# Patient Record
Sex: Female | Born: 1945 | Race: White | Hispanic: No | Marital: Married
Health system: Southern US, Community
[De-identification: ages and names within clinical notes are randomized; demographics above are authoritative.]

---

## 2013-06-17 ENCOUNTER — Ambulatory Visit: Payer: Self-pay | Admitting: Gastroenterology

## 2013-06-25 ENCOUNTER — Ambulatory Visit: Payer: Self-pay | Admitting: Gastroenterology

## 2013-06-26 ENCOUNTER — Ambulatory Visit: Payer: Self-pay | Admitting: Gastroenterology

## 2014-12-06 ENCOUNTER — Ambulatory Visit: Payer: Self-pay | Admitting: Cardiology

## 2014-12-06 LAB — CBC WITH DIFFERENTIAL/PLATELET
Basophil #: 0 10*3/uL (ref 0.0–0.1)
Basophil %: 0.6 %
EOS ABS: 0.3 10*3/uL (ref 0.0–0.7)
Eosinophil %: 4.8 %
HCT: 42.1 % (ref 35.0–47.0)
HGB: 13.8 g/dL (ref 12.0–16.0)
LYMPHS ABS: 1.6 10*3/uL (ref 1.0–3.6)
Lymphocyte %: 28 %
MCH: 30.7 pg (ref 26.0–34.0)
MCHC: 32.8 g/dL (ref 32.0–36.0)
MCV: 94 fL (ref 80–100)
Monocyte #: 0.5 x10 3/mm (ref 0.2–0.9)
Monocyte %: 9.2 %
NEUTROS PCT: 57.4 %
Neutrophil #: 3.3 10*3/uL (ref 1.4–6.5)
PLATELETS: 177 10*3/uL (ref 150–440)
RBC: 4.49 10*6/uL (ref 3.80–5.20)
RDW: 12.9 % (ref 11.5–14.5)
WBC: 5.7 10*3/uL (ref 3.6–11.0)

## 2014-12-06 LAB — URINALYSIS, COMPLETE
Bilirubin,UR: NEGATIVE
Blood: NEGATIVE
GLUCOSE, UR: NEGATIVE mg/dL (ref 0–75)
KETONE: NEGATIVE
Leukocyte Esterase: NEGATIVE
Nitrite: NEGATIVE
Ph: 5 (ref 4.5–8.0)
Protein: 30
SPECIFIC GRAVITY: 1.03 (ref 1.003–1.030)

## 2014-12-06 LAB — BASIC METABOLIC PANEL
Anion Gap: 7 (ref 7–16)
BUN: 27 mg/dL — AB (ref 7–18)
CHLORIDE: 112 mmol/L — AB (ref 98–107)
Calcium, Total: 8.9 mg/dL (ref 8.5–10.1)
Co2: 25 mmol/L (ref 21–32)
Creatinine: 1.06 mg/dL (ref 0.60–1.30)
EGFR (Non-African Amer.): 55 — ABNORMAL LOW
Glucose: 87 mg/dL (ref 65–99)
Osmolality: 291 (ref 275–301)
Potassium: 4.7 mmol/L (ref 3.5–5.1)
Sodium: 144 mmol/L (ref 136–145)

## 2014-12-06 LAB — APTT: Activated PTT: 30.2 secs (ref 23.6–35.9)

## 2014-12-06 LAB — PROTIME-INR
INR: 1
Prothrombin Time: 13.3 secs (ref 11.5–14.7)

## 2014-12-10 ENCOUNTER — Emergency Department: Payer: Self-pay | Admitting: Emergency Medicine

## 2014-12-10 ENCOUNTER — Ambulatory Visit: Payer: Self-pay | Admitting: Cardiology

## 2015-04-03 NOTE — Op Note (Signed)
PATIENT NAME:  Belinda Kim, Belinda Kim MR#:  [id number] DATE OF BIRTH:  02/18/1946  DATE OF PROCEDURE:  12/10/2014  PROCEDURE: Removal and replacement of biventricular ICD generator.   INDICATION: ICD generator at elective replacement indicator.   POSTPROCEDURE DIAGNOSIS: Implantation of a biventricular implantable cardiac defibrillator generator in situ.   DETAILS OF PROCEDURE: Written informed consent was obtained and placed in the patient'Kim permanent medical record. The risks of the procedure were explained to the patient and she consented to proceed. The patient was brought to the operating room in a fasting, nonsedated state. The appropriate resuscitative equipment was attached to the patient and the patient was prepped and draped in the usual sterile manner. The area of the left infraclavicular fossa was scrubbed with chlorhexidine. Local anesthetic was administered to the patient. Using a scalpel, an incision was made over the old incision site. Using a combination of electrocautery and blunt dissection, the ICD pocket was opened in the typical fashion and the device was removed. The leads were checked and had shown to be functioning appropriately. The pocket was irrigated with copious amounts of bacitracin. Adequate hemostasis was obtained. The new generator was attached to the old leads and placed in the pocket. The pocket was closed with running layers of 2-0 Vicryl, 3-0 and 4-0 V-Loc. Steri-Strips were applied over the incision as well as a sterile gauze and OpSite.   ANALYSIS: Of the final numbers of the patient'Kim device were as follows: Lead impedances for the atrial lead were 399 ohms, RV lead 399 ohms, LV lead pacing and LV tip to RV coil configuration was 646, RV defibrillator coil 47 ohms and the SVC coil was 61 ohms. P waves were 2.8 mV, R waves 9.6 mV. The threshold on the LV lead was in the LV coil to RV ring configuration 1 volt at 1 millisecond.   SUMMARY OF IMPLANTED HARDWARE: The  patient has a Medtronic Viva XT CRT-D device. Serial number [id number]. The pacing mode is DDD with a lower rate limit of 50, upper tracking rate 130, upper sensory rate 120. Outputs for the atrial leads 3.5 volts at 0.4 milliseconds, RV 3.5 volts at 0.4 milliseconds and the LV 4 volts at 1 millisecond. The device was programmed mode as a 2 zone device, 1 active and 1 monitored. VF detection is at rates greater than 200 beats per minute with 30 out of 40 intervals before delivering therapy and the monitor zone is set to be at 171 beats per minute.   There were no complications noted at the conclusion of the procedure. Estimated blood loss was minimal. The patient will have a 2 week followup for a wound check. Otherwise, she will continue to follow with her primary cardiologist.     ____________________________ Anna GenreKevin L. Maisie Fushomas, MD klt:TT D: 12/10/2014 14:26:46 ET T: 12/10/2014 15:25:34 ET JOB#: [id number]  cc: Caryn BeeKevin L. Maisie Fushomas, MD, <Dictator> Sharion SettlerKEVIN L Rishab Stoudt MD ELECTRONICALLY SIGNED 12/15/2014 12:26

## 2015-09-07 ENCOUNTER — Other Ambulatory Visit: Payer: Self-pay | Admitting: Internal Medicine

## 2015-09-07 ENCOUNTER — Ambulatory Visit
Admission: RE | Admit: 2015-09-07 | Discharge: 2015-09-07 | Disposition: A | Discharge status: Home / Self Care | Payer: Medicare Other | Source: Ambulatory Visit | Attending: Internal Medicine | Admitting: Internal Medicine

## 2015-09-07 DIAGNOSIS — M79605 Pain in left leg: Secondary | ICD-10-CM | POA: Insufficient documentation

## 2015-09-07 DIAGNOSIS — M79604 Pain in right leg: Secondary | ICD-10-CM

## 2015-09-07 DIAGNOSIS — M7989 Other specified soft tissue disorders: Secondary | ICD-10-CM

## 2016-06-28 ENCOUNTER — Other Ambulatory Visit: Payer: Self-pay | Admitting: Internal Medicine

## 2016-06-28 DIAGNOSIS — N183 Chronic kidney disease, stage 3 unspecified: Secondary | ICD-10-CM

## 2016-07-05 ENCOUNTER — Ambulatory Visit
Admission: RE | Admit: 2016-07-05 | Discharge: 2016-07-05 | Disposition: A | Discharge status: Home / Self Care | Payer: Medicare Other | Source: Ambulatory Visit | Attending: Internal Medicine | Admitting: Internal Medicine

## 2016-07-05 DIAGNOSIS — N183 Chronic kidney disease, stage 3 unspecified: Secondary | ICD-10-CM

## 2016-10-15 ENCOUNTER — Other Ambulatory Visit: Payer: Self-pay | Admitting: Internal Medicine

## 2016-10-15 DIAGNOSIS — Z1231 Encounter for screening mammogram for malignant neoplasm of breast: Secondary | ICD-10-CM

## 2016-11-22 ENCOUNTER — Ambulatory Visit
Admission: RE | Admit: 2016-11-22 | Discharge: 2016-11-22 | Disposition: A | Discharge status: Home / Self Care | Payer: Medicare Other | Source: Ambulatory Visit | Attending: Internal Medicine | Admitting: Internal Medicine

## 2016-11-22 DIAGNOSIS — Z1231 Encounter for screening mammogram for malignant neoplasm of breast: Secondary | ICD-10-CM | POA: Insufficient documentation

## 2016-11-30 ENCOUNTER — Inpatient Hospital Stay
Admission: RE | Admit: 2016-11-30 | Discharge: 2016-11-30 | Disposition: A | Discharge status: Home / Self Care | Payer: Self-pay | Source: Ambulatory Visit | Attending: *Deleted | Admitting: *Deleted

## 2016-11-30 ENCOUNTER — Other Ambulatory Visit: Payer: Self-pay | Admitting: *Deleted

## 2016-11-30 DIAGNOSIS — Z9289 Personal history of other medical treatment: Secondary | ICD-10-CM

## 2017-01-30 IMAGING — US US EXTREM LOW VENOUS BILAT
1 series · 13 of 24 positions shown · non-contrast
Comparison: None.

CLINICAL DATA: Bilateral lower extremity pain and swelling for 4
days.



[Series 1: us extrem low venous bilat · 0.08mm/px · 13 of 61 slices shown]
[im 1/61]
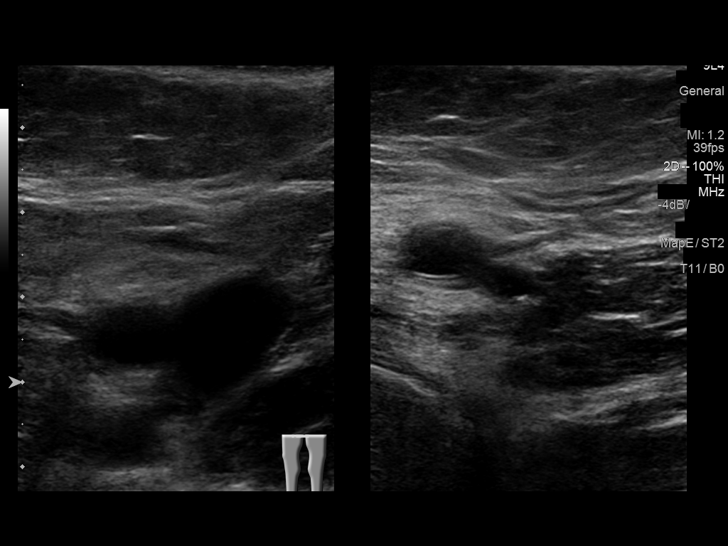
[im 6/61]
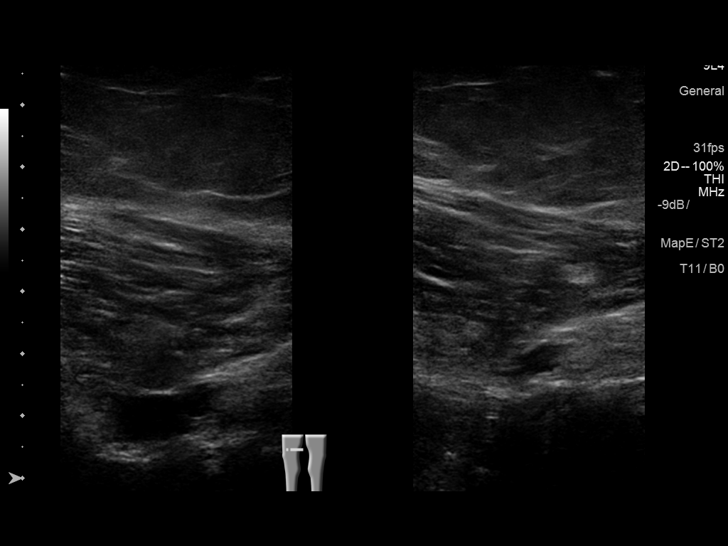
[im 11/61]
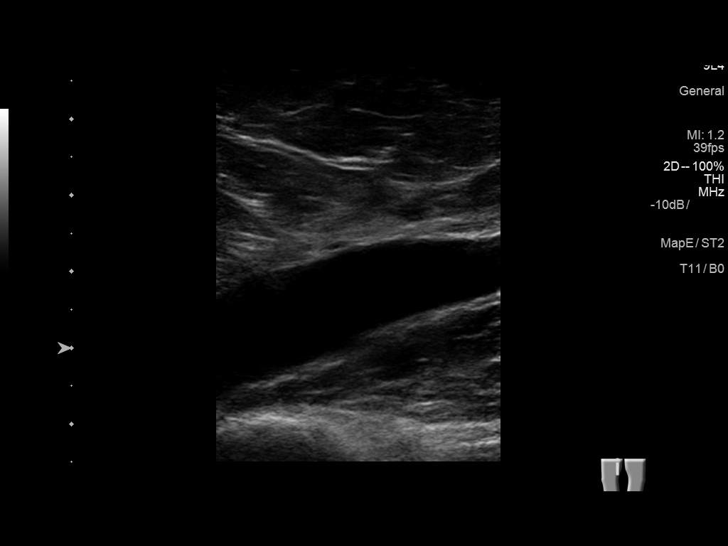
[im 16/61]
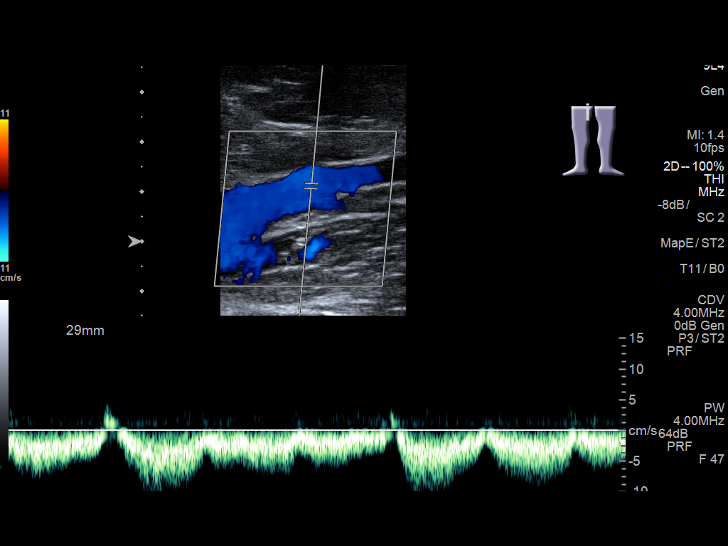
[im 21/61]
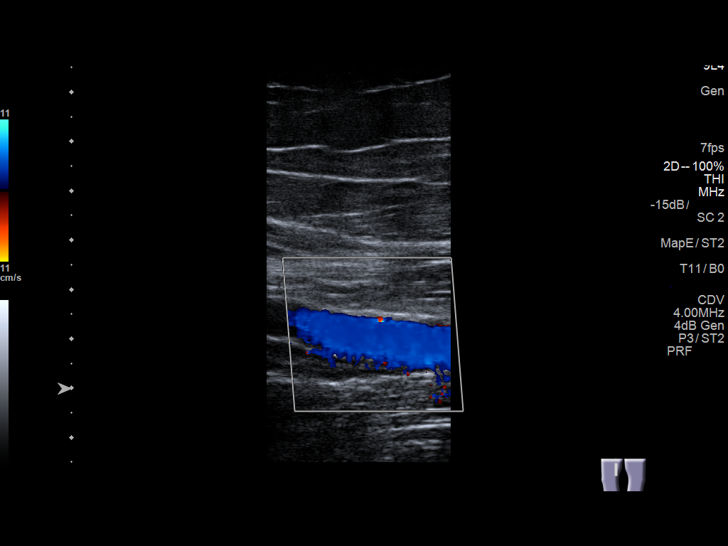
[im 27/61]
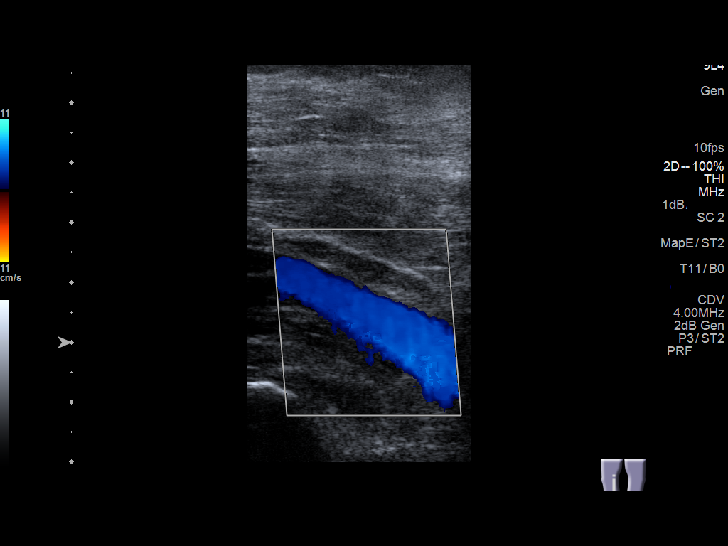
[im 32/61]
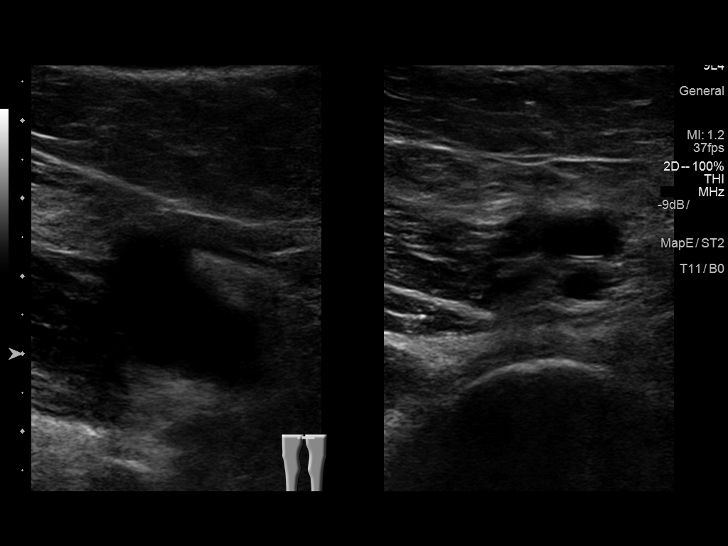
[im 34/61]
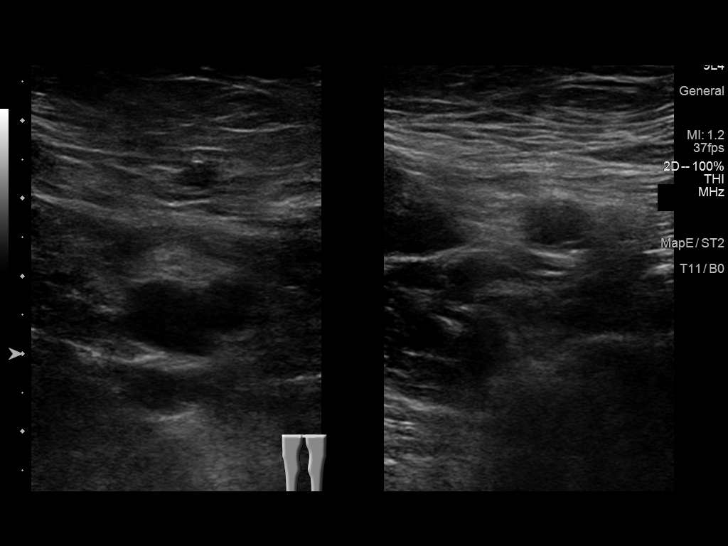
[im 40/61]
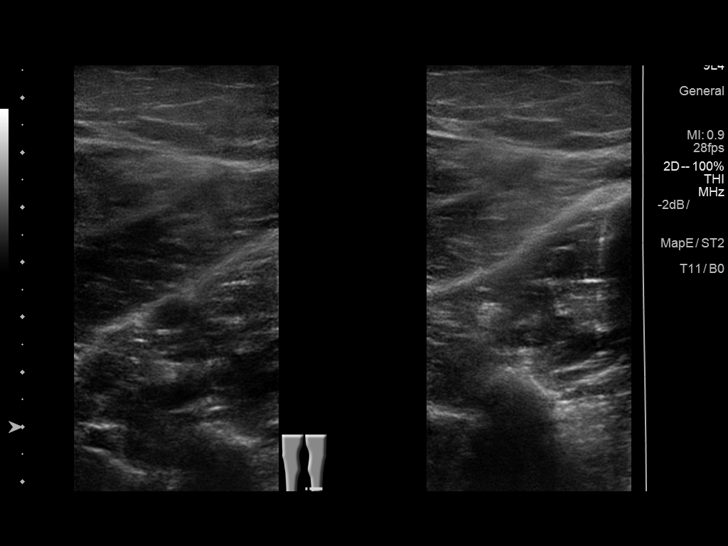
[im 45/61]
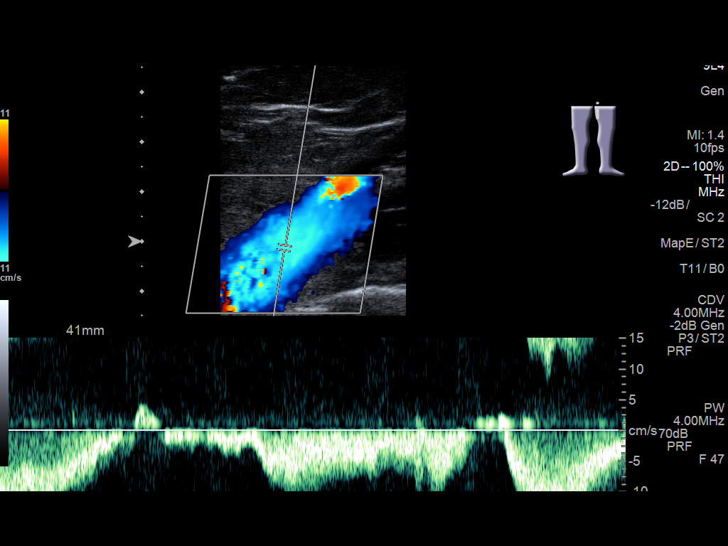
[im 50/61]
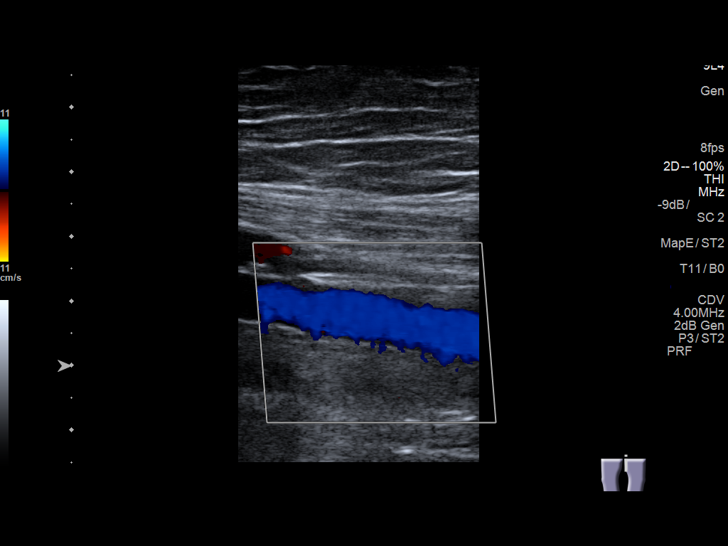
[im 55/61]
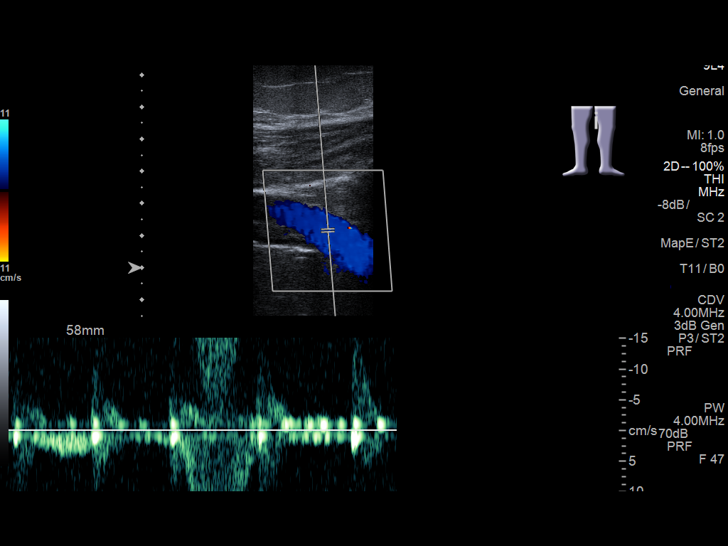
[im 61/61]
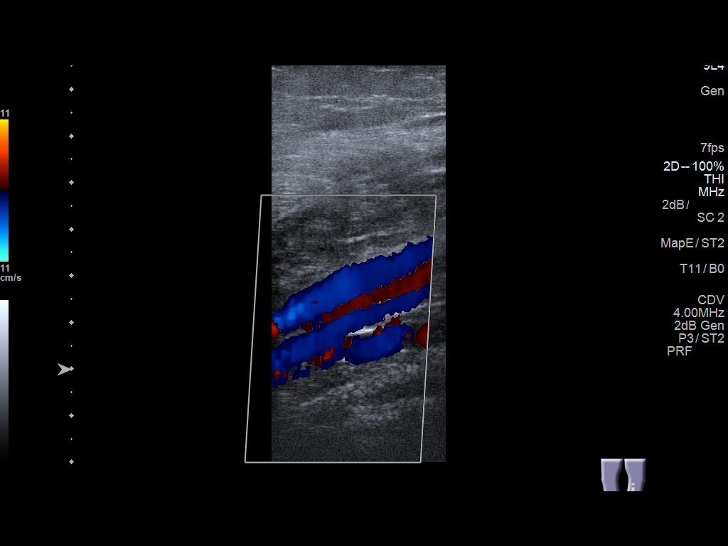

[13 of 24 positions shown; findings below may reference images not displayed]

FINDINGS: RIGHT LOWER EXTREMITY

Common Femoral Vein: No evidence of thrombus. Normal
compressibility, respiratory phasicity and response to augmentation.

Saphenofemoral Junction: No evidence of thrombus. Normal
compressibility and flow on color Doppler imaging.

Profunda Femoral Vein: No evidence of thrombus. Normal
compressibility and flow on color Doppler imaging.

Femoral Vein: No evidence of thrombus. Normal compressibility,
respiratory phasicity and response to augmentation.

Popliteal Vein: No evidence of thrombus. Normal compressibility,
respiratory phasicity and response to augmentation.

Calf Veins: No evidence of thrombus. Normal compressibility and flow
on color Doppler imaging.

Superficial Great Saphenous Vein: No evidence of thrombus. Normal
compressibility and flow on color Doppler imaging.

Venous Reflux:  None.

Other Findings:  None.

LEFT LOWER EXTREMITY

Common Femoral Vein: No evidence of thrombus. Normal
compressibility, respiratory phasicity and response to augmentation.

Saphenofemoral Junction: No evidence of thrombus. Normal
compressibility and flow on color Doppler imaging.

Profunda Femoral Vein: No evidence of thrombus. Normal
compressibility and flow on color Doppler imaging.

Femoral Vein: No evidence of thrombus. Normal compressibility,
respiratory phasicity and response to augmentation.

Popliteal Vein: No evidence of thrombus. Normal compressibility,
respiratory phasicity and response to augmentation.

Calf Veins: No evidence of thrombus. Normal compressibility and flow
on color Doppler imaging.

Superficial Great Saphenous Vein: No evidence of thrombus. Normal
compressibility and flow on color Doppler imaging.

Venous Reflux:  None.

Other Findings:  None.
IMPRESSION: No evidence of deep venous thrombosis.

## 2017-11-28 IMAGING — US US RENAL
1 series · 14 of 25 positions shown · non-contrast
Comparison: None.

CLINICAL DATA: Chronic kidney disease, stage III

EXAM:
RENAL / URINARY TRACT ULTRASOUND COMPLETE

[Series 1: us renal · 0.24mm/px · 14 of 28 slices shown]
[im 1/28]
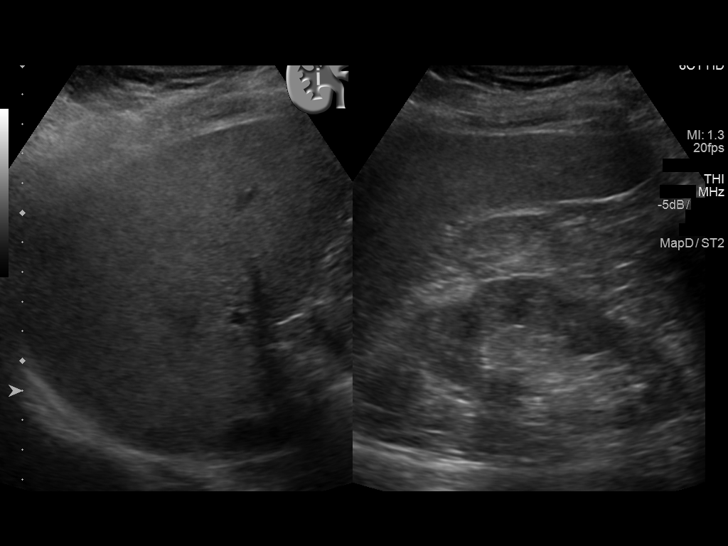
[im 3/28]
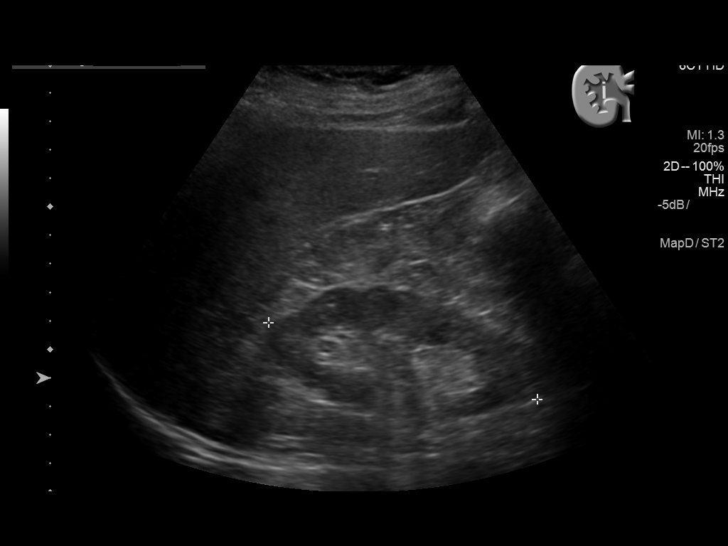
[im 5/28]
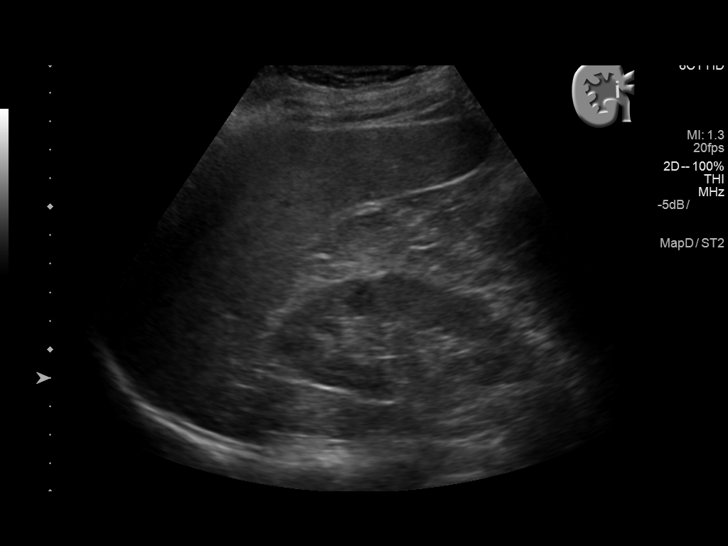
[im 7/28]
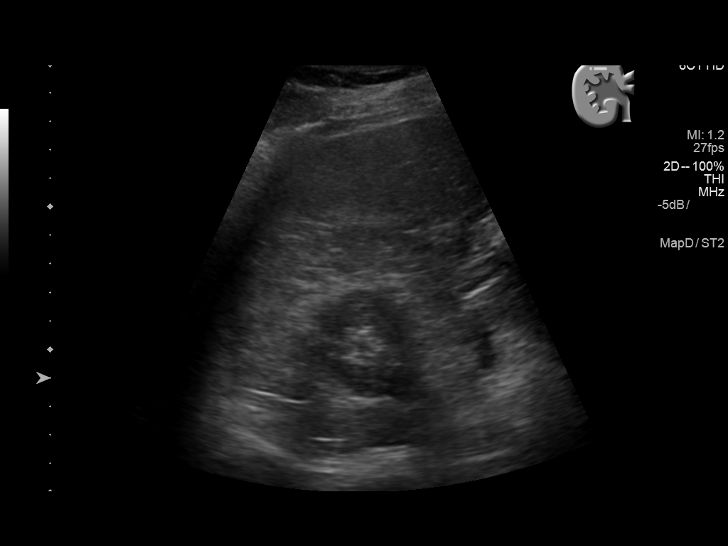
[im 10/28]
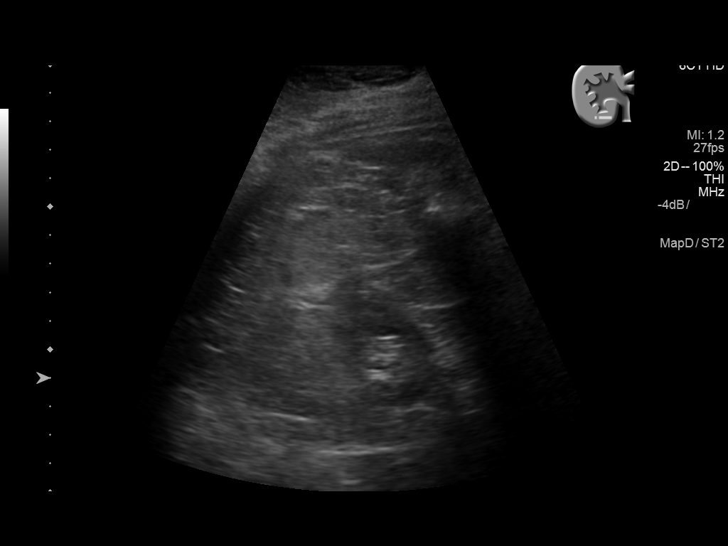
[im 11/28]
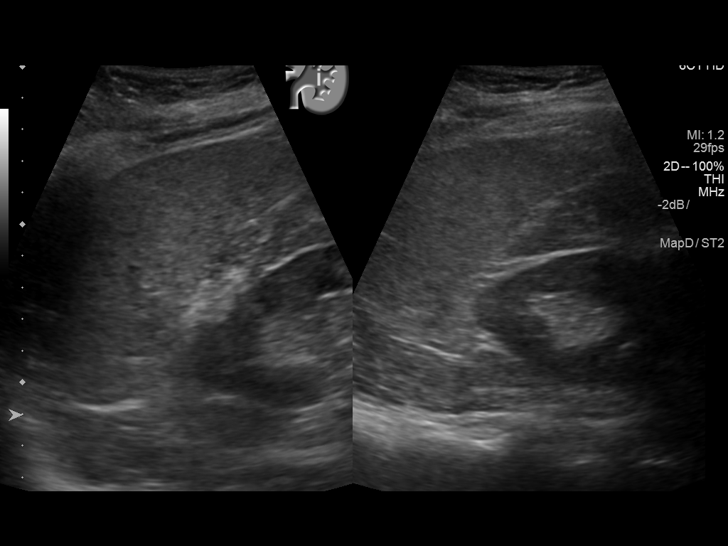
[im 13/28]
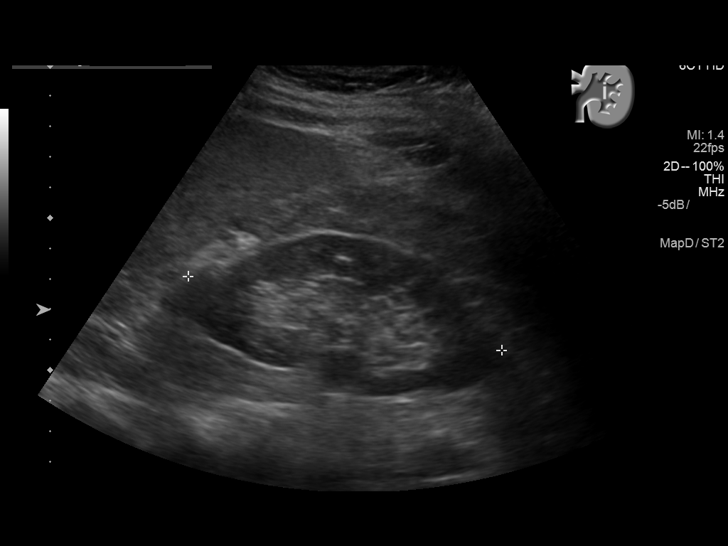
[im 15/28]
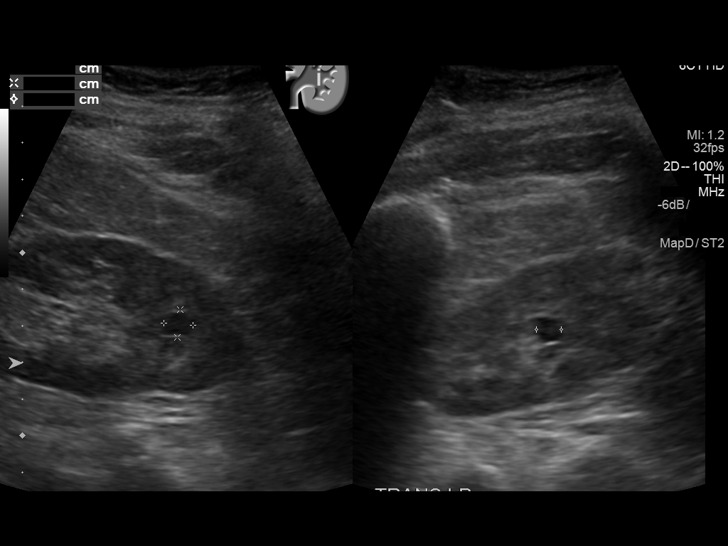
[im 17/28]
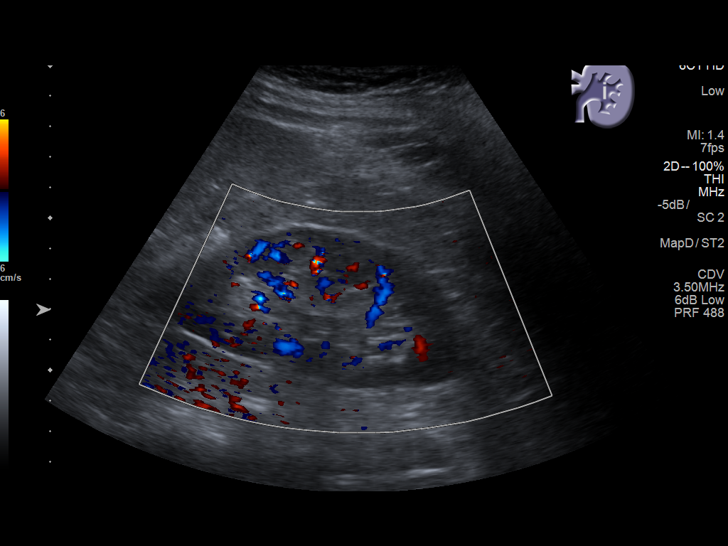
[im 19/28]
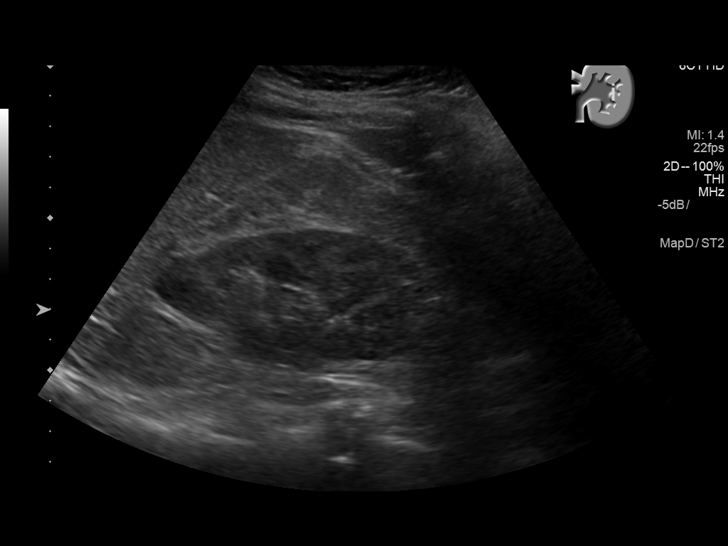
[im 21/28]
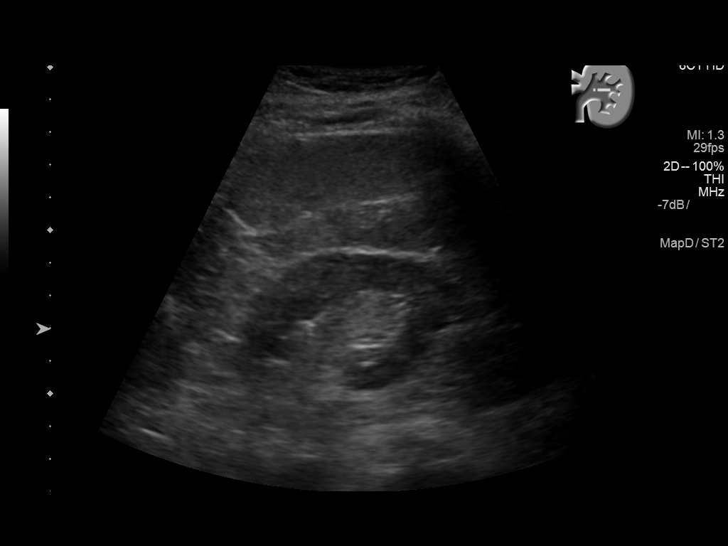
[im 23/28]
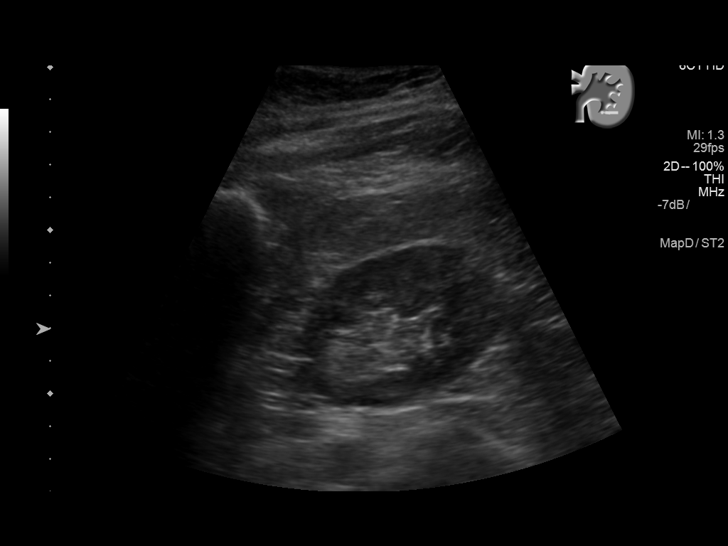
[im 25/28]
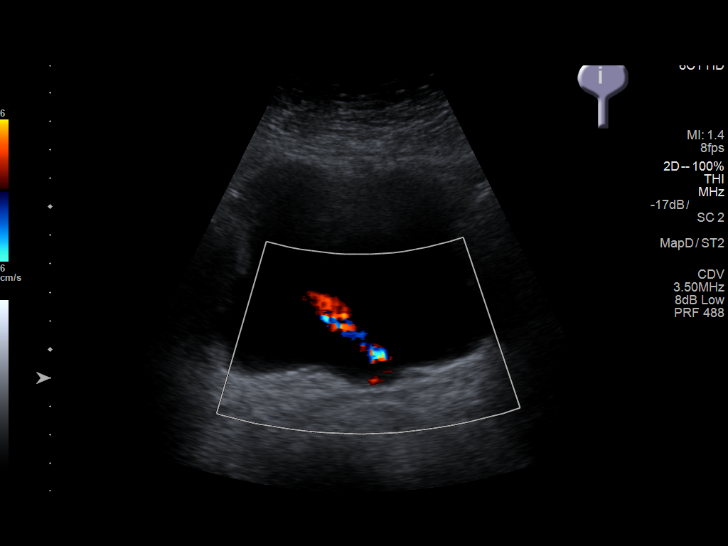
[im 28/28]
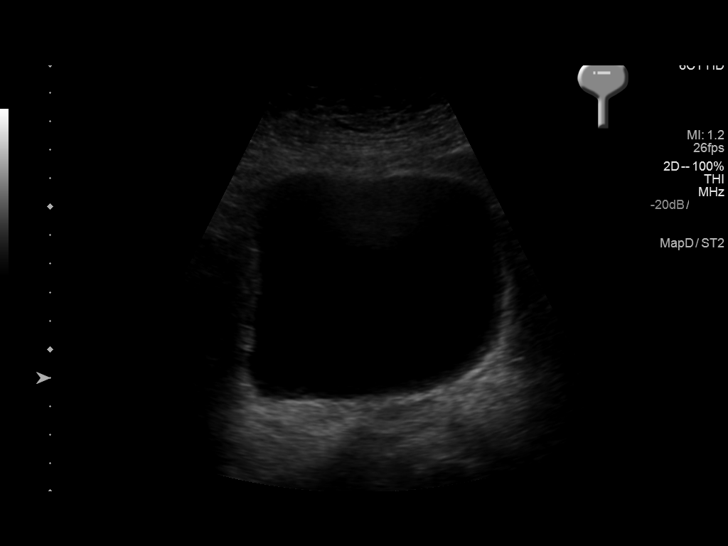

[14 of 25 positions shown; findings below may reference images not displayed]

FINDINGS: Right Kidney:

Length: 10 cm. Echogenicity within normal limits. No mass or
hydronephrosis visualized.

Left Kidney:

Length: 10.6 cm. Echogenicity within normal limits. No solid mass or
hydronephrosis visualized. 9 mm lower pole cyst.

Bladder:

Appears normal for degree of bladder distention. Bilateral ureteral
jets were noted.
IMPRESSION: No hydronephrosis, renal atrophy, or scarring.

## 2017-12-30 ENCOUNTER — Other Ambulatory Visit: Payer: Self-pay | Admitting: Internal Medicine

## 2017-12-30 DIAGNOSIS — Z1231 Encounter for screening mammogram for malignant neoplasm of breast: Secondary | ICD-10-CM

## 2018-02-18 ENCOUNTER — Ambulatory Visit
Admission: RE | Admit: 2018-02-18 | Discharge: 2018-02-18 | Disposition: A | Discharge status: Home / Self Care | Payer: Medicare Other | Source: Ambulatory Visit | Attending: Internal Medicine | Admitting: Internal Medicine

## 2018-02-18 DIAGNOSIS — Z1231 Encounter for screening mammogram for malignant neoplasm of breast: Secondary | ICD-10-CM | POA: Diagnosis present
